# Patient Record
Sex: Male | Born: 1955 | Race: White | Hispanic: No | Marital: Married | Smoking: Never smoker
Health system: Southern US, Community
[De-identification: ages and names within clinical notes are randomized; demographics above are authoritative.]

## PROBLEM LIST (undated history)

## (undated) DIAGNOSIS — I10 Essential (primary) hypertension: Secondary | ICD-10-CM

---

## 2008-06-11 ENCOUNTER — Emergency Department (HOSPITAL_BASED_OUTPATIENT_CLINIC_OR_DEPARTMENT_OTHER): Admission: EM | Admit: 2008-06-11 | Discharge: 2008-06-11 | Payer: Self-pay | Admitting: Emergency Medicine

## 2008-06-11 ENCOUNTER — Ambulatory Visit: Payer: Self-pay | Admitting: Diagnostic Radiology

## 2016-10-23 ENCOUNTER — Emergency Department (HOSPITAL_BASED_OUTPATIENT_CLINIC_OR_DEPARTMENT_OTHER)
Admission: EM | Admit: 2016-10-23 | Discharge: 2016-10-23 | Disposition: A | Discharge status: Home / Self Care | Payer: BLUE CROSS/BLUE SHIELD | Attending: Emergency Medicine | Admitting: Emergency Medicine

## 2016-10-23 ENCOUNTER — Emergency Department (HOSPITAL_BASED_OUTPATIENT_CLINIC_OR_DEPARTMENT_OTHER): Payer: BLUE CROSS/BLUE SHIELD

## 2016-10-23 ENCOUNTER — Encounter (HOSPITAL_BASED_OUTPATIENT_CLINIC_OR_DEPARTMENT_OTHER): Payer: Self-pay | Admitting: Emergency Medicine

## 2016-10-23 DIAGNOSIS — Y999 Unspecified external cause status: Secondary | ICD-10-CM | POA: Diagnosis not present

## 2016-10-23 DIAGNOSIS — D649 Anemia, unspecified: Secondary | ICD-10-CM | POA: Diagnosis not present

## 2016-10-23 DIAGNOSIS — I10 Essential (primary) hypertension: Secondary | ICD-10-CM | POA: Diagnosis not present

## 2016-10-23 DIAGNOSIS — R42 Dizziness and giddiness: Secondary | ICD-10-CM

## 2016-10-23 DIAGNOSIS — Y939 Activity, unspecified: Secondary | ICD-10-CM | POA: Insufficient documentation

## 2016-10-23 DIAGNOSIS — S161XXA Strain of muscle, fascia and tendon at neck level, initial encounter: Secondary | ICD-10-CM | POA: Insufficient documentation

## 2016-10-23 DIAGNOSIS — Y929 Unspecified place or not applicable: Secondary | ICD-10-CM | POA: Insufficient documentation

## 2016-10-23 HISTORY — DX: Essential (primary) hypertension: I10

## 2016-10-23 LAB — I-STAT CG4 LACTIC ACID, ED: Lactic Acid, Venous: 1.51 mmol/L (ref 0.5–1.9)

## 2016-10-23 LAB — CBC
HCT: 28.5 % — ABNORMAL LOW (ref 39.0–52.0)
Hemoglobin: 8.8 g/dL — ABNORMAL LOW (ref 13.0–17.0)
MCH: 24.6 pg — ABNORMAL LOW (ref 26.0–34.0)
MCHC: 30.9 g/dL (ref 30.0–36.0)
MCV: 79.8 fL (ref 78.0–100.0)
PLATELETS: 516 10*3/uL — AB (ref 150–400)
RBC: 3.57 MIL/uL — AB (ref 4.22–5.81)
RDW: 15.9 % — AB (ref 11.5–15.5)
WBC: 8.2 10*3/uL (ref 4.0–10.5)

## 2016-10-23 LAB — COMPREHENSIVE METABOLIC PANEL
ALT: 16 U/L — AB (ref 17–63)
AST: 15 U/L (ref 15–41)
Albumin: 3 g/dL — ABNORMAL LOW (ref 3.5–5.0)
Alkaline Phosphatase: 91 U/L (ref 38–126)
Anion gap: 12 (ref 5–15)
BILIRUBIN TOTAL: 0.3 mg/dL (ref 0.3–1.2)
BUN: 16 mg/dL (ref 6–20)
CHLORIDE: 100 mmol/L — AB (ref 101–111)
CO2: 25 mmol/L (ref 22–32)
CREATININE: 0.92 mg/dL (ref 0.61–1.24)
Calcium: 8.9 mg/dL (ref 8.9–10.3)
Glucose, Bld: 118 mg/dL — ABNORMAL HIGH (ref 65–99)
POTASSIUM: 4 mmol/L (ref 3.5–5.1)
Sodium: 137 mmol/L (ref 135–145)
TOTAL PROTEIN: 7.2 g/dL (ref 6.5–8.1)

## 2016-10-23 LAB — OCCULT BLOOD X 1 CARD TO LAB, STOOL: FECAL OCCULT BLD: NEGATIVE

## 2016-10-23 MED ORDER — IOPAMIDOL (ISOVUE-370) INJECTION 76%
100.0000 mL | Freq: Once | INTRAVENOUS | Status: AC | PRN
  Administered 2016-10-23: 100 mL via INTRAVENOUS

## 2016-10-23 MED ORDER — CYCLOBENZAPRINE HCL 10 MG PO TABS: 10.0000 mg | ORAL_TABLET | Freq: Two times a day (BID) | ORAL | 0 refills | Status: AC | PRN

## 2016-10-23 MED ORDER — SODIUM CHLORIDE 0.9 % IV BOLUS (SEPSIS)
1000.0000 mL | Freq: Once | INTRAVENOUS | Status: AC
  Administered 2016-10-23: 1000 mL via INTRAVENOUS

## 2016-10-23 MED ORDER — DOCUSATE SODIUM 100 MG PO CAPS: 100.0000 mg | ORAL_CAPSULE | Freq: Every day | ORAL | 0 refills | Status: AC

## 2016-10-23 MED ORDER — FERROUS SULFATE 325 (65 FE) MG PO TABS: 325.0000 mg | ORAL_TABLET | Freq: Every day | ORAL | 0 refills | Status: AC

## 2016-10-23 MED ORDER — POLYETHYLENE GLYCOL 3350 17 GM/SCOOP PO POWD: 17.0000 g | Freq: Two times a day (BID) | ORAL | 0 refills | Status: AC
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
## 2016-10-23 NOTE — ED Notes (Signed)
Patient transported to CT 

## 2016-10-23 NOTE — ED Notes (Signed)
ED Provider at bedside. 
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
## 2016-10-23 NOTE — Discharge Instructions (Signed)
1. STOP TAKING IBUPROFEN AND TAKE TYLENOL AND/OR FLEXERIL INSTEAD. 2. START TAKING IRON DAILY WITH COLACE AND MIRALAX TO PREVENT CONSIPATION. 3. SEE PRIMARY CARE PROVIDER AS SOON AS POSSIBLE.

## 2019-12-10 ENCOUNTER — Encounter (HOSPITAL_BASED_OUTPATIENT_CLINIC_OR_DEPARTMENT_OTHER): Payer: Self-pay

## 2019-12-10 ENCOUNTER — Emergency Department (HOSPITAL_BASED_OUTPATIENT_CLINIC_OR_DEPARTMENT_OTHER)
Admission: EM | Admit: 2019-12-10 | Discharge: 2019-12-10 | Disposition: A | Discharge status: Home / Self Care | Payer: BC Managed Care – PPO | Attending: Emergency Medicine | Admitting: Emergency Medicine

## 2019-12-10 ENCOUNTER — Emergency Department (HOSPITAL_BASED_OUTPATIENT_CLINIC_OR_DEPARTMENT_OTHER): Payer: BC Managed Care – PPO

## 2019-12-10 ENCOUNTER — Other Ambulatory Visit: Payer: Self-pay

## 2019-12-10 DIAGNOSIS — S0990XA Unspecified injury of head, initial encounter: Secondary | ICD-10-CM | POA: Diagnosis present

## 2019-12-10 DIAGNOSIS — R079 Chest pain, unspecified: Secondary | ICD-10-CM | POA: Diagnosis not present

## 2019-12-10 DIAGNOSIS — S060X1A Concussion with loss of consciousness of 30 minutes or less, initial encounter: Secondary | ICD-10-CM

## 2019-12-10 DIAGNOSIS — I10 Essential (primary) hypertension: Secondary | ICD-10-CM | POA: Insufficient documentation

## 2019-12-10 LAB — CBC
HCT: 40.9 % (ref 39.0–52.0)
Hemoglobin: 14 g/dL (ref 13.0–17.0)
MCH: 31.4 pg (ref 26.0–34.0)
MCHC: 34.2 g/dL (ref 30.0–36.0)
MCV: 91.7 fL (ref 80.0–100.0)
Platelets: 241 10*3/uL (ref 150–400)
RBC: 4.46 MIL/uL (ref 4.22–5.81)
RDW: 12.5 % (ref 11.5–15.5)
WBC: 7.1 10*3/uL (ref 4.0–10.5)
nRBC: 0 % (ref 0.0–0.2)

## 2019-12-10 LAB — TROPONIN I (HIGH SENSITIVITY)
Troponin I (High Sensitivity): 5 ng/L (ref <18)
Troponin I (High Sensitivity): 5 ng/L (ref <18)

## 2019-12-10 LAB — BASIC METABOLIC PANEL
Anion gap: 10 (ref 5–15)
BUN: 22 mg/dL (ref 8–23)
CO2: 24 mmol/L (ref 22–32)
Calcium: 9.2 mg/dL (ref 8.9–10.3)
Chloride: 106 mmol/L (ref 98–111)
Creatinine, Ser: 0.9 mg/dL (ref 0.61–1.24)
GFR, Estimated: >60 mL/min (ref >60)
Glucose, Bld: 100 mg/dL — ABNORMAL HIGH (ref 70–99)
Potassium: 3.9 mmol/L (ref 3.5–5.1)
Sodium: 140 mmol/L (ref 135–145)

## 2019-12-10 MED ORDER — IOHEXOL 350 MG/ML SOLN
100.0000 mL | Freq: Once | INTRAVENOUS | Status: AC | PRN
  Administered 2019-12-10: 100 mL via INTRAVENOUS
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
## 2019-12-10 NOTE — ED Notes (Signed)
ED Provider at bedside. 

## 2019-12-10 NOTE — ED Notes (Signed)
PT changed into Gown and hooked up to monitor, Q30 BP
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
## 2019-12-10 NOTE — ED Notes (Signed)
Discussed case with EDP Rees-orders to continue with CP order set protocol only at this time
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
## 2022-05-10 ENCOUNTER — Emergency Department (HOSPITAL_BASED_OUTPATIENT_CLINIC_OR_DEPARTMENT_OTHER)
Admission: EM | Admit: 2022-05-10 | Discharge: 2022-05-10 | Disposition: A | Discharge status: Home / Self Care | Payer: BC Managed Care – PPO | Attending: Emergency Medicine | Admitting: Emergency Medicine

## 2022-05-10 ENCOUNTER — Emergency Department (HOSPITAL_BASED_OUTPATIENT_CLINIC_OR_DEPARTMENT_OTHER): Payer: BC Managed Care – PPO

## 2022-05-10 ENCOUNTER — Other Ambulatory Visit: Payer: Self-pay

## 2022-05-10 ENCOUNTER — Encounter (HOSPITAL_BASED_OUTPATIENT_CLINIC_OR_DEPARTMENT_OTHER): Payer: Self-pay | Admitting: Emergency Medicine

## 2022-05-10 DIAGNOSIS — I10 Essential (primary) hypertension: Secondary | ICD-10-CM | POA: Diagnosis not present

## 2022-05-10 DIAGNOSIS — L03211 Cellulitis of face: Secondary | ICD-10-CM | POA: Diagnosis not present

## 2022-05-10 DIAGNOSIS — I160 Hypertensive urgency: Secondary | ICD-10-CM | POA: Diagnosis not present

## 2022-05-10 DIAGNOSIS — K029 Dental caries, unspecified: Secondary | ICD-10-CM

## 2022-05-10 DIAGNOSIS — R220 Localized swelling, mass and lump, head: Secondary | ICD-10-CM | POA: Diagnosis present

## 2022-05-10 LAB — CBC WITH DIFFERENTIAL/PLATELET
Abs Immature Granulocytes: 0.07 10*3/uL (ref 0.00–0.07)
Basophils Absolute: 0.1 10*3/uL (ref 0.0–0.1)
Basophils Relative: 1 %
Eosinophils Absolute: 0.1 10*3/uL (ref 0.0–0.5)
Eosinophils Relative: 2 %
HCT: 40.6 % (ref 39.0–52.0)
Hemoglobin: 14.1 g/dL (ref 13.0–17.0)
Immature Granulocytes: 1 %
Lymphocytes Relative: 15 %
Lymphs Abs: 1.1 10*3/uL (ref 0.7–4.0)
MCH: 31.6 pg (ref 26.0–34.0)
MCHC: 34.7 g/dL (ref 30.0–36.0)
MCV: 91 fL (ref 80.0–100.0)
Monocytes Absolute: 0.8 10*3/uL (ref 0.1–1.0)
Monocytes Relative: 11 %
Neutro Abs: 5.2 10*3/uL (ref 1.7–7.7)
Neutrophils Relative %: 70 %
Platelets: 218 10*3/uL (ref 150–400)
RBC: 4.46 MIL/uL (ref 4.22–5.81)
RDW: 12.8 % (ref 11.5–15.5)
WBC: 7.3 10*3/uL (ref 4.0–10.5)
nRBC: 0 % (ref 0.0–0.2)

## 2022-05-10 LAB — COMPREHENSIVE METABOLIC PANEL
ALT: 29 U/L (ref 0–44)
AST: 23 U/L (ref 15–41)
Albumin: 3.9 g/dL (ref 3.5–5.0)
Alkaline Phosphatase: 98 U/L (ref 38–126)
Anion gap: 8 (ref 5–15)
BUN: 11 mg/dL (ref 8–23)
CO2: 24 mmol/L (ref 22–32)
Calcium: 8.6 mg/dL — ABNORMAL LOW (ref 8.9–10.3)
Chloride: 102 mmol/L (ref 98–111)
Creatinine, Ser: 0.84 mg/dL (ref 0.61–1.24)
GFR, Estimated: >60 mL/min (ref >60)
Glucose, Bld: 129 mg/dL — ABNORMAL HIGH (ref 70–99)
Potassium: 3.6 mmol/L (ref 3.5–5.1)
Sodium: 134 mmol/L — ABNORMAL LOW (ref 135–145)
Total Bilirubin: 0.8 mg/dL (ref 0.3–1.2)
Total Protein: 6.9 g/dL (ref 6.5–8.1)

## 2022-05-10 LAB — TROPONIN I (HIGH SENSITIVITY)
Troponin I (High Sensitivity): 10 ng/L (ref <18)
Troponin I (High Sensitivity): 10 ng/L (ref <18)

## 2022-05-10 LAB — PROCALCITONIN: Procalcitonin: <0.1 ng/mL

## 2022-05-10 MED ORDER — DIPHENHYDRAMINE HCL 50 MG/ML IJ SOLN
12.5000 mg | Freq: Once | INTRAMUSCULAR | Status: AC
  Administered 2022-05-10: 12.5 mg via INTRAVENOUS
  Filled 2022-05-10: qty 1

## 2022-05-10 MED ORDER — SODIUM CHLORIDE 0.9 % IV SOLN
12.5000 mg | Freq: Three times a day (TID) | INTRAVENOUS | Status: DC | PRN
  Filled 2022-05-10: qty 0.5

## 2022-05-10 MED ORDER — LACTATED RINGERS IV BOLUS
1000.0000 mL | Freq: Once | INTRAVENOUS | Status: AC
  Administered 2022-05-10: 1000 mL via INTRAVENOUS

## 2022-05-10 MED ORDER — ONDANSETRON HCL 4 MG/2ML IJ SOLN: 4.0000 mg | Freq: Four times a day (QID) | INTRAMUSCULAR | Status: DC | PRN

## 2022-05-10 MED ORDER — HYDRALAZINE HCL 20 MG/ML IJ SOLN
10.0000 mg | Freq: Once | INTRAMUSCULAR | Status: AC
  Administered 2022-05-10: 10 mg via INTRAVENOUS
  Filled 2022-05-10: qty 1

## 2022-05-10 MED ORDER — DOXYCYCLINE HYCLATE 100 MG PO CAPS: 100.0000 mg | ORAL_CAPSULE | Freq: Two times a day (BID) | ORAL | 0 refills | Status: AC

## 2022-05-10 MED ORDER — SODIUM CHLORIDE 0.9 % IV SOLN: 3.0000 g | Freq: Four times a day (QID) | INTRAVENOUS | Status: DC

## 2022-05-10 MED ORDER — ONDANSETRON 4 MG PO TBDP
4.0000 mg | ORAL_TABLET | Freq: Three times a day (TID) | ORAL | 0 refills | Status: DC | PRN
  Filled 2022-05-10: qty 20, 7d supply, fill #0

## 2022-05-10 MED ORDER — AMOXICILLIN-POT CLAVULANATE 875-125 MG PO TABS
1.0000 | ORAL_TABLET | Freq: Two times a day (BID) | ORAL | 0 refills | Status: DC
  Filled 2022-05-10: qty 14, 7d supply, fill #0

## 2022-05-10 MED ORDER — ACETAMINOPHEN 325 MG PO TABS
650.0000 mg | ORAL_TABLET | Freq: Once | ORAL | Status: DC
  Filled 2022-05-10: qty 2

## 2022-05-10 MED ORDER — IOHEXOL 300 MG/ML  SOLN
100.0000 mL | Freq: Once | INTRAMUSCULAR | Status: AC | PRN
  Administered 2022-05-10: 75 mL via INTRAVENOUS

## 2022-05-10 MED ORDER — CARVEDILOL 6.25 MG PO TABS: 6.2500 mg | ORAL_TABLET | Freq: Two times a day (BID) | ORAL | Status: DC

## 2022-05-10 MED ORDER — SODIUM CHLORIDE 0.9 % IV SOLN
3.0000 g | Freq: Once | INTRAVENOUS | Status: AC
  Administered 2022-05-10: 3 g via INTRAVENOUS

## 2022-05-10 MED ORDER — KETOROLAC TROMETHAMINE 30 MG/ML IJ SOLN
15.0000 mg | Freq: Once | INTRAMUSCULAR | Status: AC
  Administered 2022-05-10: 15 mg via INTRAVENOUS
  Filled 2022-05-10: qty 1

## 2022-05-10 MED ORDER — LACTATED RINGERS IV SOLN: INTRAVENOUS | Status: DC

## 2022-05-10 MED ORDER — HYDRALAZINE HCL 20 MG/ML IJ SOLN: 10.0000 mg | Freq: Four times a day (QID) | INTRAMUSCULAR | Status: DC | PRN

## 2022-05-10 MED ORDER — AMOXICILLIN-POT CLAVULANATE 875-125 MG PO TABS: 1.0000 | ORAL_TABLET | Freq: Two times a day (BID) | ORAL | 0 refills | Status: AC

## 2022-05-10 MED ORDER — AMLODIPINE BESYLATE 5 MG PO TABS: 10.0000 mg | ORAL_TABLET | Freq: Every day | ORAL | Status: DC

## 2022-05-10 MED ORDER — METOCLOPRAMIDE HCL 5 MG/ML IJ SOLN
10.0000 mg | Freq: Once | INTRAMUSCULAR | Status: AC
  Administered 2022-05-10: 10 mg via INTRAVENOUS
  Filled 2022-05-10: qty 2

## 2022-05-10 MED ORDER — ONDANSETRON 4 MG PO TBDP: 4.0000 mg | ORAL_TABLET | Freq: Three times a day (TID) | ORAL | 0 refills | Status: AC | PRN

## 2022-05-10 MED ORDER — DOXYCYCLINE HYCLATE 100 MG PO CAPS
100.0000 mg | ORAL_CAPSULE | Freq: Two times a day (BID) | ORAL | 0 refills | Status: DC
  Filled 2022-05-10: qty 14, 7d supply, fill #0

## 2022-05-10 MED ORDER — ONDANSETRON HCL 4 MG/2ML IJ SOLN
4.0000 mg | Freq: Once | INTRAMUSCULAR | Status: AC
  Administered 2022-05-10: 4 mg via INTRAVENOUS
  Filled 2022-05-10: qty 2
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
## 2022-05-10 NOTE — ED Provider Notes (Signed)
[place] [facility] Provider Note   CSN: [id number] Arrival date & time: 05/10/22  1123     History  Chief Complaint  Patient presents with   Neck Swelling    Kevin Hartman is a 67 y.o. male.  HPI   67 year old male with medical history significant for hypertension who presents to the emergency department with multiple complaints.  The patient states that he developed right-sided jaw swelling over the past day.  He has a history of poor dentition.  He states that the swelling has radiated from his jaw and is now present on the right side of his neck.  He denies any difficulty swallowing or difficulty breathing.  He denies any fevers or chills.  Additionally, the patient states he developed a headache yesterday located posteriorly radiating to the front of his head.  He had high blood pressure today associated with his headache.  No fevers, neck stiffness.  He states that he had blurry vision associated with his high blood pressure.  He Hartman in clinic for a prostate biopsy earlier today and his blood pressure Hartman abnormally high and so he Hartman sent to the emergency department for further evaluation.  He continues to endorse a headache and mild blurry vision with associated nausea.  He states also that he developed mild chest pressure earlier today, no radiation, no shortness of breath.   Home Medications Prior to Admission medications   Medication Sig Start Date End Date Taking? Authorizing Provider  cyclobenzaprine (FLEXERIL) 10 MG tablet Take 1 tablet (10 mg total) by mouth 2 (two) times daily as needed for muscle spasms. 10/23/16   Little, Wenda Overland, MD  docusate sodium (COLACE) 100 MG capsule Take 1 capsule (100 mg total) by mouth daily. 10/23/16   Little, Wenda Overland, MD  ferrous sulfate 325 (65 FE) MG tablet Take 1 tablet (325 mg total) by mouth daily. 10/23/16   Little, Wenda Overland, MD  polyethylene glycol powder (GLYCOLAX/MIRALAX) powder Take  17 g by mouth 2 (two) times daily. Or as needed Until daily soft stools  OTC 10/23/16   Little, Wenda Overland, MD      Allergies    Tramadol    Review of Systems   Review of Systems  All other systems reviewed and are negative.   Physical Exam Updated Vital Signs BP (!) 166/103   Pulse 82   Temp (!) 97.5 F (36.4 C) (Oral)   Resp 20   Ht '5\' 11"'$  (1.803 m)   Wt 104.3 kg   SpO2 97%   BMI 32.08 kg/m  Physical Exam Vitals and nursing note reviewed.  Constitutional:      General: He is not in acute distress.    Appearance: He is well-developed.  HENT:     Head: Normocephalic and atraumatic.     Comments: Swelling about the right jaw and neck, no crepitus. No trismus. No clear periodontal abscess Eyes:     Conjunctiva/sclera: Conjunctivae normal.  Cardiovascular:     Rate and Rhythm: Normal rate and regular rhythm.  Pulmonary:     Effort: Pulmonary effort is normal. No respiratory distress.     Breath sounds: Normal breath sounds.  Abdominal:     Palpations: Abdomen is soft.     Tenderness: There is no abdominal tenderness.  Musculoskeletal:        General: No swelling.     Cervical back: Neck supple.  Skin:    General: Skin is warm and dry.  Capillary Refill: Capillary refill takes less than 2 seconds.  Neurological:     Mental Status: He is alert.     GCS: GCS eye subscore is 4. GCS verbal subscore is 5. GCS motor subscore is 6.     Cranial Nerves: Cranial nerves 2-12 are intact.     Sensory: Sensation is intact.     Motor: Motor function is intact.     Coordination: Coordination is intact.  Psychiatric:        Mood and Affect: Mood normal.     ED Results / Procedures / Treatments   Labs (all labs ordered are listed, but only abnormal results are displayed) Labs Reviewed  COMPREHENSIVE METABOLIC PANEL - Abnormal; Notable for the following components:      Result Value   Sodium 134 (*)    Glucose, Bld 129 (*)    Calcium 8.6 (*)    All other components  within normal limits  CULTURE, BLOOD (ROUTINE X 2)  CULTURE, BLOOD (ROUTINE X 2)  CBC WITH DIFFERENTIAL/PLATELET  TROPONIN I (HIGH SENSITIVITY)  TROPONIN I (HIGH SENSITIVITY)    EKG EKG Interpretation  Date/Time:  Tuesday May 10 2022 11:53:37 EDT Ventricular Rate:  77 PR Interval:  156 QRS Duration: 96 QT Interval:  373 QTC Calculation: 423 R Axis:   33 Text Interpretation: Sinus rhythm No significant change since last tracing Confirmed by Regan Lemming ([id number]) on 05/10/2022 12:06:09 PM  Radiology CT Soft Tissue Neck W Contrast  Result Date: 05/10/2022 CLINICAL DATA:  Soft tissue infection suspected. Right-sided jaw and neck swelling EXAM: CT NECK WITH CONTRAST TECHNIQUE: Multidetector CT imaging of the neck Hartman performed using the standard protocol following the bolus administration of intravenous contrast. RADIATION DOSE REDUCTION: This exam Hartman performed according to the departmental dose-optimization program which includes automated exposure control, adjustment of the mA and/or kV according to patient size and/or use of iterative reconstruction technique. CONTRAST:  71m OMNIPAQUE IOHEXOL 300 MG/ML  SOLN COMPARISON:  12/10/2019 FINDINGS: Pharynx and larynx: No mucosal or submucosal lesion. No evidence of deep space inflammation. Salivary glands: Parotid and submandibular glands are normal. Thyroid: Normal Lymph nodes: No lymphadenopathy on either side of the neck. Vascular: Carotid artery atherosclerosis. No flow limiting stenosis suspected at either bifurcation. This is not a detailed vascular study. Limited intracranial: Normal Visualized orbits: Normal Mastoids and visualized paranasal sinuses: Clear Skeleton: Degenerative spondylosis and facet arthritis. No acute finding of the cervical spine. There is abnormal lucency around the roots right mandibular teeth 29 and 30 with advanced dental decay a. maxillary dentition all show shows decay and periodontal disease. Soft tissue swelling of  the gingival tissues adjacent to the right mandible, probably driving from the disease of teeth 29 and 30. No evidence of low-density collection. Cellulitis type pattern within the fat of the right jaw and right neck without drainable soft tissue abscess lower in the neck. Upper chest: Normal Other: None IMPRESSION: Advanced dental decay and periodontal disease affecting the right mandibular teeth 29 and 30. Soft tissue swelling of the gingival tissues adjacent to the right mandible, probably driving from the disease of teeth 29 and 30. No evidence of low-density collection. Cellulitis type pattern within the fat of the right jaw and right neck without drainable soft tissue abscess. No deep space extension. Electronically Signed   By: MNelson ChimesM.D.   On: 05/10/2022 15:05   CT Head Wo Contrast  Result Date: 05/10/2022 CLINICAL DATA:  Headache. Neurological deficit. Right-sided jaw and neck swelling.  Blurred vision. EXAM: CT HEAD WITHOUT CONTRAST TECHNIQUE: Contiguous axial images were obtained from the base of the skull through the vertex without intravenous contrast. RADIATION DOSE REDUCTION: This exam Hartman performed according to the departmental dose-optimization program which includes automated exposure control, adjustment of the mA and/or kV according to patient size and/or use of iterative reconstruction technique. COMPARISON:  08/01/2021 FINDINGS: Brain: No focal abnormality affects the brainstem or cerebellum. Cerebral hemispheres show advanced chronic small-vessel ischemic change of the white matter. No identifiable acute infarction, mass lesion, hemorrhage, hydrocephalus or extra-axial collection. Vascular: There is atherosclerotic calcification of the major vessels at the base of the brain. Skull: Negative Sinuses/Orbits: Clear/normal Other: None IMPRESSION: No acute CT finding. Advanced chronic small-vessel ischemic changes of the cerebral hemispheric white matter. Electronically Signed   By: Nelson Chimes M.D.   On: 05/10/2022 15:00   DG Chest Portable 1 View  Result Date: 05/10/2022 CLINICAL DATA:  Chest pressure.  Hypertension. EXAM: PORTABLE CHEST 1 VIEW COMPARISON:  12/10/2019 FINDINGS: Heart size is normal. Aortic atherosclerotic calcification is seen. Question hiatal hernia. The lungs are clear. The vascularity is normal. No effusions. IMPRESSION: No active disease. Aortic atherosclerotic calcification. Question hiatal hernia. Electronically Signed   By: Nelson Chimes M.D.   On: 05/10/2022 14:58    Procedures Procedures    Medications Ordered in ED Medications  acetaminophen (TYLENOL) tablet 650 mg (650 mg Oral Not Given 05/10/22 1533)  ketorolac (TORADOL) 30 MG/ML injection 15 mg (has no administration in time range)  ondansetron (ZOFRAN) injection 4 mg (has no administration in time range)  Ampicillin-Sulbactam (UNASYN) 3 g in sodium chloride 0.9 % 100 mL IVPB (0 g Intravenous Stopped 05/10/22 1536)  lactated ringers bolus 1,000 mL (0 mLs Intravenous Stopped 05/10/22 1536)  metoCLOPramide (REGLAN) injection 10 mg (10 mg Intravenous Given 05/10/22 1357)  diphenhydrAMINE (BENADRYL) injection 12.5 mg (12.5 mg Intravenous Given 05/10/22 1355)  iohexol (OMNIPAQUE) 300 MG/ML solution 100 mL (75 mLs Intravenous Contrast Given 05/10/22 1433)  hydrALAZINE (APRESOLINE) injection 10 mg (10 mg Intravenous Given 05/10/22 1534)  hydrALAZINE (APRESOLINE) injection 10 mg (10 mg Intravenous Given 05/10/22 1614)    ED Course/ Medical Decision Making/ A&P Clinical Course as of 05/10/22 1656  Tue May 10, 2022  1601 66 M with right jaw and face cellulitis, no abscess. Getting IV antibiotics. Also having HA, normal head CT, HTN, follow up Trops and symptoms/BP.  [VB]  [id number] Spoke with Dr. Haig Prophet or on-call dentistry, he says he does not typically come into the hospital to do inpatient consults or pull teeth.  He would recommend continued antibiotics and then follow-up outpatient and recommends blood  pressure control before he would be able to pull patient's tooth. [VB]  [id number] Patient still complaining of headache there Hartman no ICH on CT head.  Blood pressures have been improving but I have ordered for Zofran since he is unable to keep down medications having hypertensive urgency will request admission for continued IV antibiotics as well for facial cellulitis and hypertension control. [VB]    Clinical Course User Index [VB] Elgie Congo, MD                             Medical Decision Making Amount and/or Complexity of Data Reviewed Labs: ordered. Radiology: ordered.  Risk OTC drugs. Prescription drug management.    67 year old male with medical history significant for hypertension who presents to the emergency department with multiple complaints.  The patient states that he developed right-sided jaw swelling over the past day.  He has a history of poor dentition.  He states that the swelling has radiated from his jaw and is now present on the right side of his neck.  He denies any difficulty swallowing or difficulty breathing.  He denies any fevers or chills.  Additionally, the patient states he developed a headache yesterday located posteriorly radiating to the front of his head.  He had high blood pressure today associated with his headache.  No fevers, neck stiffness.  He states that he had blurry vision associated with his high blood pressure.  He Hartman in clinic for a prostate biopsy earlier today and his blood pressure Hartman abnormally high and so he Hartman sent to the emergency department for further evaluation.  He continues to endorse a headache and mild blurry vision with associated nausea.  He states also that he developed mild chest pressure earlier today, no radiation, no shortness of breath.   On arrival, the patient Hartman afebrile, not tachycardic, mildly tachypneic RR 22, BP 176/114.  Differential diagnosis includes odontogenic abscess, facial cellulitis, less likely allergic  reaction or angioedema.  No tongue elevation to suggest Ludwig's Angina.  Additionally considered ACS, hypertensive emergency, migraine headache. Headache Hartman gradual in onset, not maximal in onset, came on gradually over the afternoon yesterday, lower concern for SAH/ICH.  CT Head:  IMPRESSION:  No acute CT finding. Advanced chronic small-vessel ischemic changes  of the cerebral hemispheric white matter.   IV access Hartman obtained and the patient Hartman administered a migraine cocktail in addition to IV Unasyn due to concern for possible facial and neck infection.  CT Neck W: IMPRESSION:  Advanced dental decay and periodontal disease affecting the right  mandibular teeth 29 and 30. Soft tissue swelling of the gingival  tissues adjacent to the right mandible, probably driving from the  disease of teeth 29 and 30. No evidence of low-density collection.  Cellulitis type pattern within the fat of the right jaw and right  neck without drainable soft tissue abscess. No deep space extension.   CXR: No acute abnormality  Initial EKG revealed sinus rhythm, no acute ischemic changes.  Laboratory evaluation significant for initial cardiac troponin 10, CMP without evidence of an AKI or liver dysfunction, no significant electrolyte abnormality,'s CBC without leukocytosis or anemia.  Due to the patient's blurry vision and uncontrolled hypertension, considered hypertensive emergency.  He Hartman administered hydralazine IV x 2 and endorses persistent headache symptoms.  Plan at time of signout to follow-up the patient's remaining troponin, consider admission for uncontrolled hypertension.  If coming to the hospital, patient could benefit from dentistry consult.  Signout given to Dr. Nechama Guard at 1600.  Final Clinical Impression(s) / ED Diagnoses Final diagnoses:  Hypertensive urgency  Facial cellulitis    Rx / DC Orders ED Discharge Orders     None         Regan Lemming, MD 05/10/22 1656

## 2022-05-10 NOTE — ED Notes (Signed)
Patient still reporting pain, provider made aware

## 2022-05-10 NOTE — ED Notes (Signed)
Blood culture number two drawn via 23 gauge butterfly from R hand.

## 2022-05-10 NOTE — ED Notes (Signed)
Pt in bed pt reports decreased pain, pt states that he is feeling better and ready to go home, sig other at bedside, pt verbalized understanding d/c instructions and follow up, reviewed prescriptions, pt ambulatory from dpt.

## 2022-05-10 NOTE — Plan of Care (Signed)
Kevin Hartman, is a 67 y.o. male, DOB - 1956/02/20, [id number] with no major medical problems, comes in with dental pain and facial swelling, has dental infection and facial cellulitis, no abscess. Dentist on call Dr Haig Prophet - wants IV ABX x few days then outpt follow up.   HTN poor controlled with headache and nuasea - likely from facial pain - Coreg + Norvasc + hydralazine PRN.  Admit to Tele, IV A, BP Control.     Vitals:   05/10/22 1602 05/10/22 1615 05/10/22 1630 05/10/22 1700  BP:  (!) 184/100 (!) 153/88 (!) 172/110  Pulse:  80 93 100  Resp:  (!) '27 18 15  '$ Temp: (!) 97.5 F (36.4 C)     TempSrc: Oral     SpO2:  97% 100% 98%  Weight:      Height:            Data Review   Micro Results No results found for this or any previous visit (from the past 240 hour(s)).  Radiology Reports CT Soft Tissue Neck W Contrast  Result Date: 05/10/2022 CLINICAL DATA:  Soft tissue infection suspected. Right-sided jaw and neck swelling EXAM: CT NECK WITH CONTRAST TECHNIQUE: Multidetector CT imaging of the neck was performed using the standard protocol following the bolus administration of intravenous contrast. RADIATION DOSE REDUCTION: This exam was performed according to the departmental dose-optimization program which includes automated exposure control, adjustment of the mA and/or kV according to patient size and/or use of iterative reconstruction technique. CONTRAST:  55m OMNIPAQUE IOHEXOL 300 MG/ML  SOLN COMPARISON:  12/10/2019 FINDINGS: Pharynx and larynx: No mucosal or submucosal lesion. No evidence of deep space inflammation. Salivary glands: Parotid and submandibular glands are normal. Thyroid: Normal Lymph nodes: No lymphadenopathy on either side of the neck. Vascular: Carotid artery atherosclerosis. No flow limiting stenosis suspected at either bifurcation. This is not a detailed vascular study.  Limited intracranial: Normal Visualized orbits: Normal Mastoids and visualized paranasal sinuses: Clear Skeleton: Degenerative spondylosis and facet arthritis. No acute finding of the cervical spine. There is abnormal lucency around the roots right mandibular teeth 29 and 30 with advanced dental decay a. maxillary dentition all show shows decay and periodontal disease. Soft tissue swelling of the gingival tissues adjacent to the right mandible, probably driving from the disease of teeth 29 and 30. No evidence of low-density collection. Cellulitis type pattern within the fat of the right jaw and right neck without drainable soft tissue abscess lower in the neck. Upper chest: Normal Other: None IMPRESSION: Advanced dental decay and periodontal disease affecting the right mandibular teeth 29 and 30. Soft tissue swelling of the gingival tissues adjacent to the right mandible, probably driving from the disease of teeth 29 and 30. No evidence of low-density collection. Cellulitis type pattern within the fat of the right jaw and right neck without drainable soft tissue abscess. No deep space extension. Electronically Signed   By: MNelson ChimesM.D.   On: 05/10/2022 15:05   CT Head Wo Contrast  Result Date: 05/10/2022 CLINICAL DATA:  Headache. Neurological deficit. Right-sided jaw and neck swelling. Blurred vision. EXAM: CT HEAD WITHOUT CONTRAST TECHNIQUE: Contiguous axial images were obtained from the base of the skull through the vertex without intravenous contrast. RADIATION DOSE REDUCTION: This exam was performed according to the departmental dose-optimization program which includes automated exposure control, adjustment of the mA and/or kV according to patient size and/or use of iterative reconstruction technique. COMPARISON:  08/01/2021 FINDINGS: Brain: No focal abnormality  affects the brainstem or cerebellum. Cerebral hemispheres show advanced chronic small-vessel ischemic change of the white matter. No  identifiable acute infarction, mass lesion, hemorrhage, hydrocephalus or extra-axial collection. Vascular: There is atherosclerotic calcification of the major vessels at the base of the brain. Skull: Negative Sinuses/Orbits: Clear/normal Other: None IMPRESSION: No acute CT finding. Advanced chronic small-vessel ischemic changes of the cerebral hemispheric white matter. Electronically Signed   By: Nelson Chimes M.D.   On: 05/10/2022 15:00   DG Chest Portable 1 View  Result Date: 05/10/2022 CLINICAL DATA:  Chest pressure.  Hypertension. EXAM: PORTABLE CHEST 1 VIEW COMPARISON:  12/10/2019 FINDINGS: Heart size is normal. Aortic atherosclerotic calcification is seen. Question hiatal hernia. The lungs are clear. The vascularity is normal. No effusions. IMPRESSION: No active disease. Aortic atherosclerotic calcification. Question hiatal hernia. Electronically Signed   By: Nelson Chimes M.D.   On: 05/10/2022 14:58    CBC Recent Labs  Lab 05/10/22 1254  WBC 7.3  HGB 14.1  HCT 40.6  PLT 218  MCV 91.0  MCH 31.6  MCHC 34.7  RDW 12.8  LYMPHSABS 1.1  MONOABS 0.8  EOSABS 0.1  BASOSABS 0.1    Chemistries  Recent Labs  Lab 05/10/22 1254  NA 134*  K 3.6  CL 102  CO2 24  GLUCOSE 129*  BUN 11  CREATININE 0.84  CALCIUM 8.6*  AST 23  ALT 29  ALKPHOS 98  BILITOT 0.8   ------------------------------------------------------------------------------------------------------------------ estimated creatinine clearance is 103.5 mL/min (by C-G formula based on SCr of 0.84 mg/dL). ------------------------------------------------------------------------------------------------------------------ No results for input(s): "HGBA1C" in the last 72 hours. ------------------------------------------------------------------------------------------------------------------ No results for input(s): "CHOL", "HDL", "LDLCALC", "TRIG", "CHOLHDL", "LDLDIRECT" in the last 72  hours. ------------------------------------------------------------------------------------------------------------------ No results for input(s): "TSH", "T4TOTAL", "T3FREE", "THYROIDAB" in the last 72 hours.  Invalid input(s): "FREET3" ------------------------------------------------------------------------------------------------------------------ No results for input(s): "VITAMINB12", "FOLATE", "FERRITIN", "TIBC", "IRON", "RETICCTPCT" in the last 72 hours.  Coagulation profile No results for input(s): "INR", "PROTIME" in the last 168 hours.  No results for input(s): "DDIMER" in the last 72 hours.  Cardiac Enzymes No results for input(s): "CKMB", "TROPONINI", "MYOGLOBIN" in the last 168 hours.  Invalid input(s): "CK" ------------------------------------------------------------------------------------------------------------------ Invalid input(s): "POCBNP"   Signature  Lala Lund M.D on 05/10/2022 at 5:18 PM   -  To page go to [email or web address]

## 2022-05-10 NOTE — ED Provider Notes (Signed)
  Physical Exam  BP (!) 148/99   Pulse 98   Temp (!) 97.5 F (36.4 C) (Oral)   Resp 20   Ht 5\' 11"  (1.803 m)   Wt 104.3 kg   SpO2 91%   BMI 32.08 kg/m   Physical Exam Constitutional:      Appearance: Normal appearance.  HENT:     Head:     Comments: Mild right facial erythema and swelling mandibular region, no tongue raising or drooling, dental caries right posterior inferior molars no abscess Cardiovascular:     Rate and Rhythm: Normal rate.  Neurological:     Mental Status: He is alert.     Procedures  Procedures  ED Course / MDM   Clinical Course as of 05/10/22 1820  Tue May 10, 2022  1601 26 M with right jaw and face cellulitis, no abscess. Getting IV antibiotics. Also having HA, normal head CT, HTN, follow up Trops and symptoms/BP.  [VB]  [id number] Spoke with Dr. Haig Prophet or on-call dentistry, he says he does not typically come into the hospital to do inpatient consults or pull teeth.  He would recommend continued antibiotics and then follow-up outpatient and recommends blood pressure control before he would be able to pull patient's tooth. [VB]  [id number] Patient still complaining of headache there was no ICH on CT head.  Blood pressures have been improving but I have ordered for Zofran since he is unable to keep down medications having hypertensive urgency will request admission for continued IV antibiotics as well for facial cellulitis and hypertension control. [VB]  1717 Spoke with Dr. Candiss Norse, plan to admit patient for continued blood pressure management in the setting of hypertensive urgency and continued IV antibiotics for facial cellulitis [VB]  6834 . [VB]  1962 I reassessed the patient as he is requesting go home.  He feels significantly better and headache is resolved.  He is neurologically intact.  Blood pressure improved 148/99.  Without any chest pain or shortness of breath.  Face looks significantly improved he has no signs of Ludwig's.  Will provide with dental  outpatient send follow-up and continued antibiotics and strict return precautions and follow-up with PCP for blood pressure management.  He is in agreement plan safer discharge at this time. [VB]    Clinical Course User Index [VB] Elgie Congo, MD   Medical Decision Making Amount and/or Complexity of Data Reviewed Labs: ordered. Radiology: ordered.  Risk OTC drugs. Prescription drug management. Decision regarding hospitalization.       Elgie Congo, MD 05/10/22 [phone]

## 2022-05-10 NOTE — ED Notes (Signed)
Pt in bed, pt reports headache and htn since last night, pt states that his pain has progressively gotten worse. Pt awake and oriented, wife states that he has slight slurred speech.

## 2022-05-10 NOTE — ED Triage Notes (Signed)
Pt arrives pov, slow gait, c/o RT side jaw and neck swelling. Also endorse HA, hypertension and blurred vision with nausea

## 2022-05-10 NOTE — Discharge Instructions (Signed)
You were seen in the emergency department today for elevated blood pressure and facial swelling and pain.    You have an infection of the face that is coming from your teeth.  Take the antibiotics as prescribed do not miss any doses.  Make an appointment with the dentist Dr. Haig Prophet listed in your discharge paperwork tomorrow to get your teeth removed.  You can also show up in the office to make an appointment or use the dental resources chart.  Take Tylenol and ibuprofen as needed for pain.  Take Zofran as needed for nausea or vomiting.   Regarding her high blood pressures :  Your exam/workup today does not appear to show any abnormalities that would require admission. It is very important that you take all of your medications as prescribed, every day.   Please follow up with your doctor in the next 2-3 days to discuss your elevated blood pressures and your current medication regimen.  Return to the emergency department if you develop any sudden/severe headache, confusion, slurred speech, worsening vision, weakness or numbness of any arm or leg, or for any chest pain, pressure, worsening facial swelling, drooling or trouble breathing.

## 2022-05-11 ENCOUNTER — Other Ambulatory Visit (HOSPITAL_BASED_OUTPATIENT_CLINIC_OR_DEPARTMENT_OTHER): Payer: Self-pay

## 2022-05-15 LAB — CULTURE, BLOOD (ROUTINE X 2)
Culture: NO GROWTH
Culture: NO GROWTH
Special Requests: ADEQUATE
Special Requests: ADEQUATE

## 2022-10-05 IMAGING — CR DG CHEST 2V
2 series · 2 of 2 positions shown · non-contrast
Comparison: 0505

CLINICAL DATA: Chest pain

EXAM:
CHEST - 2 VIEW

[w chest pa]
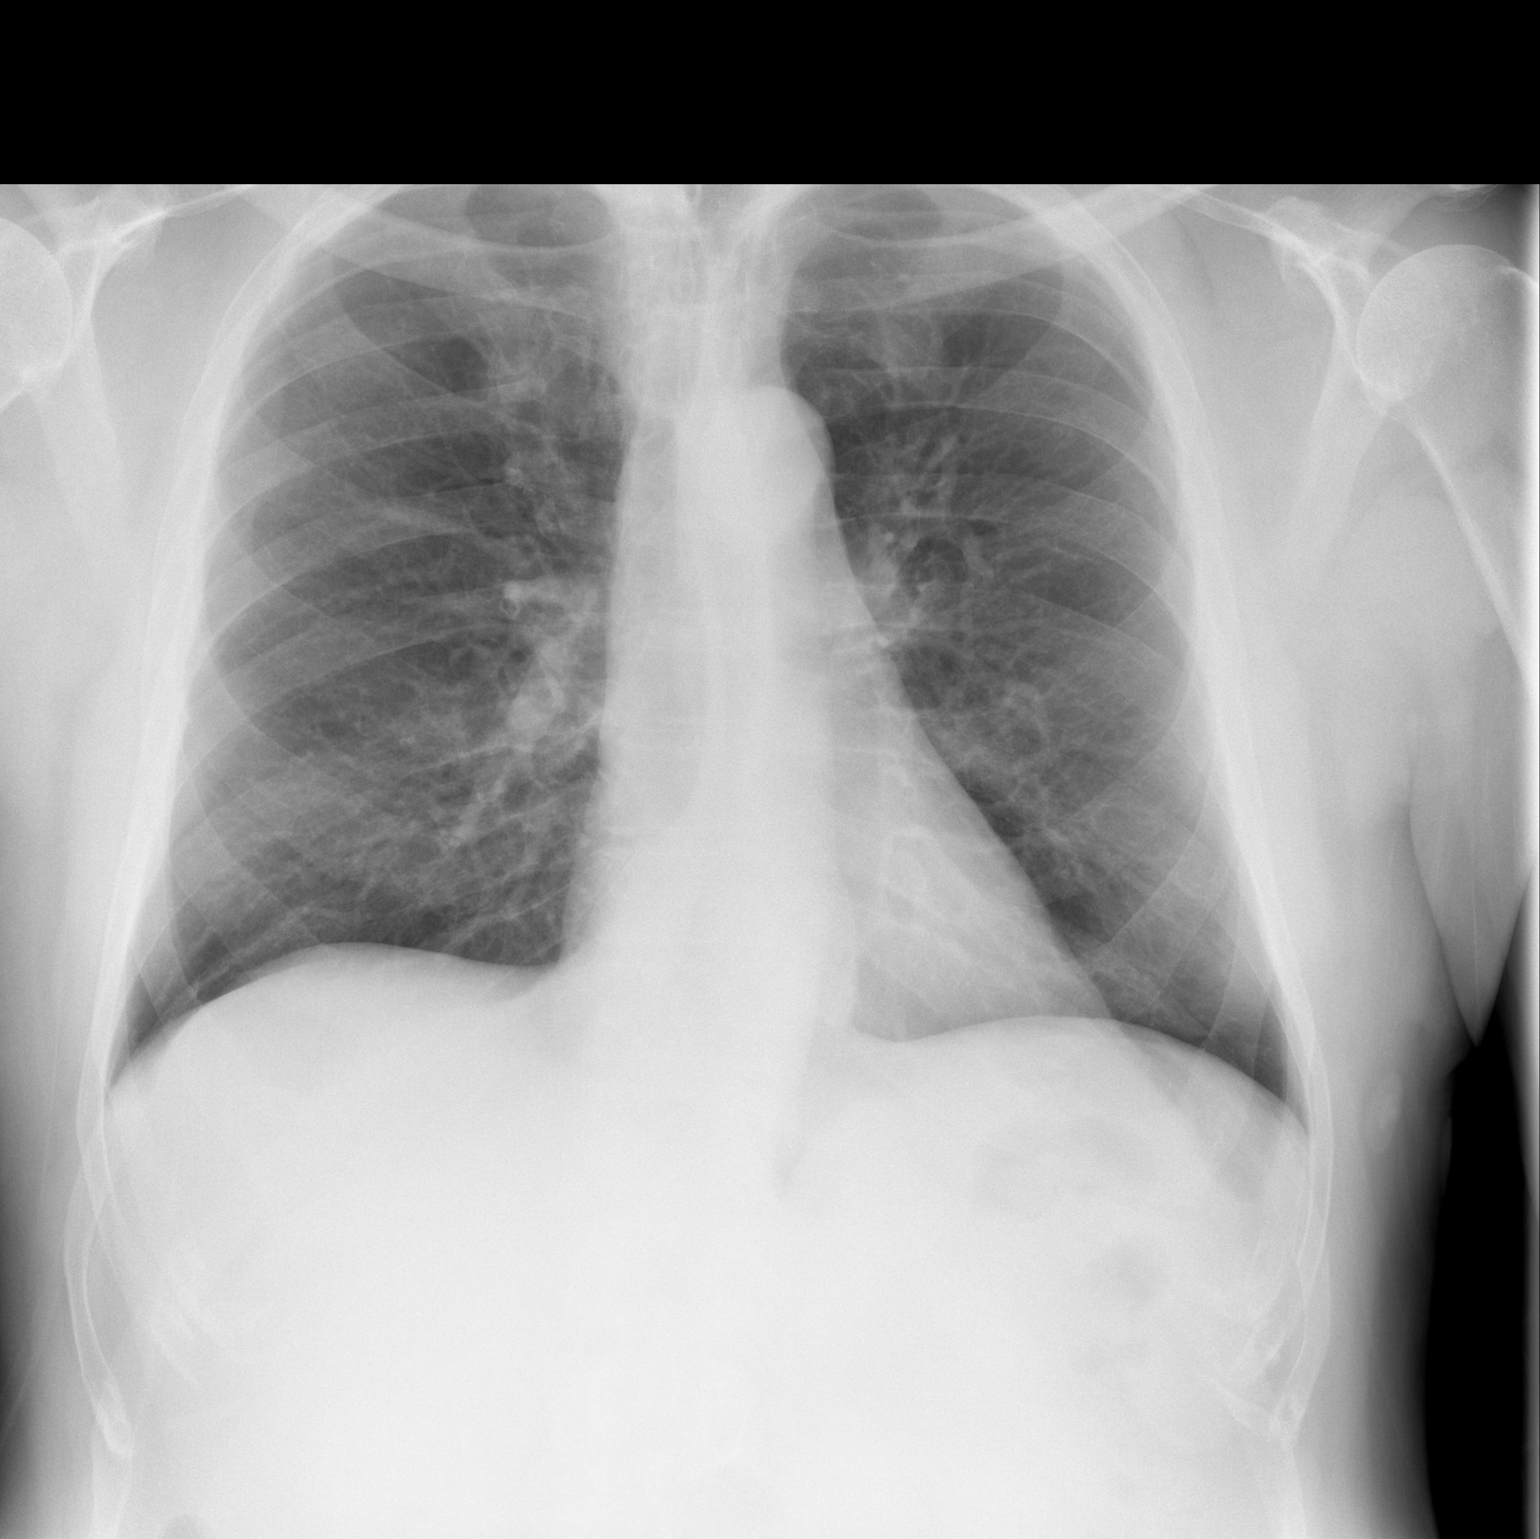

[w chest lat]
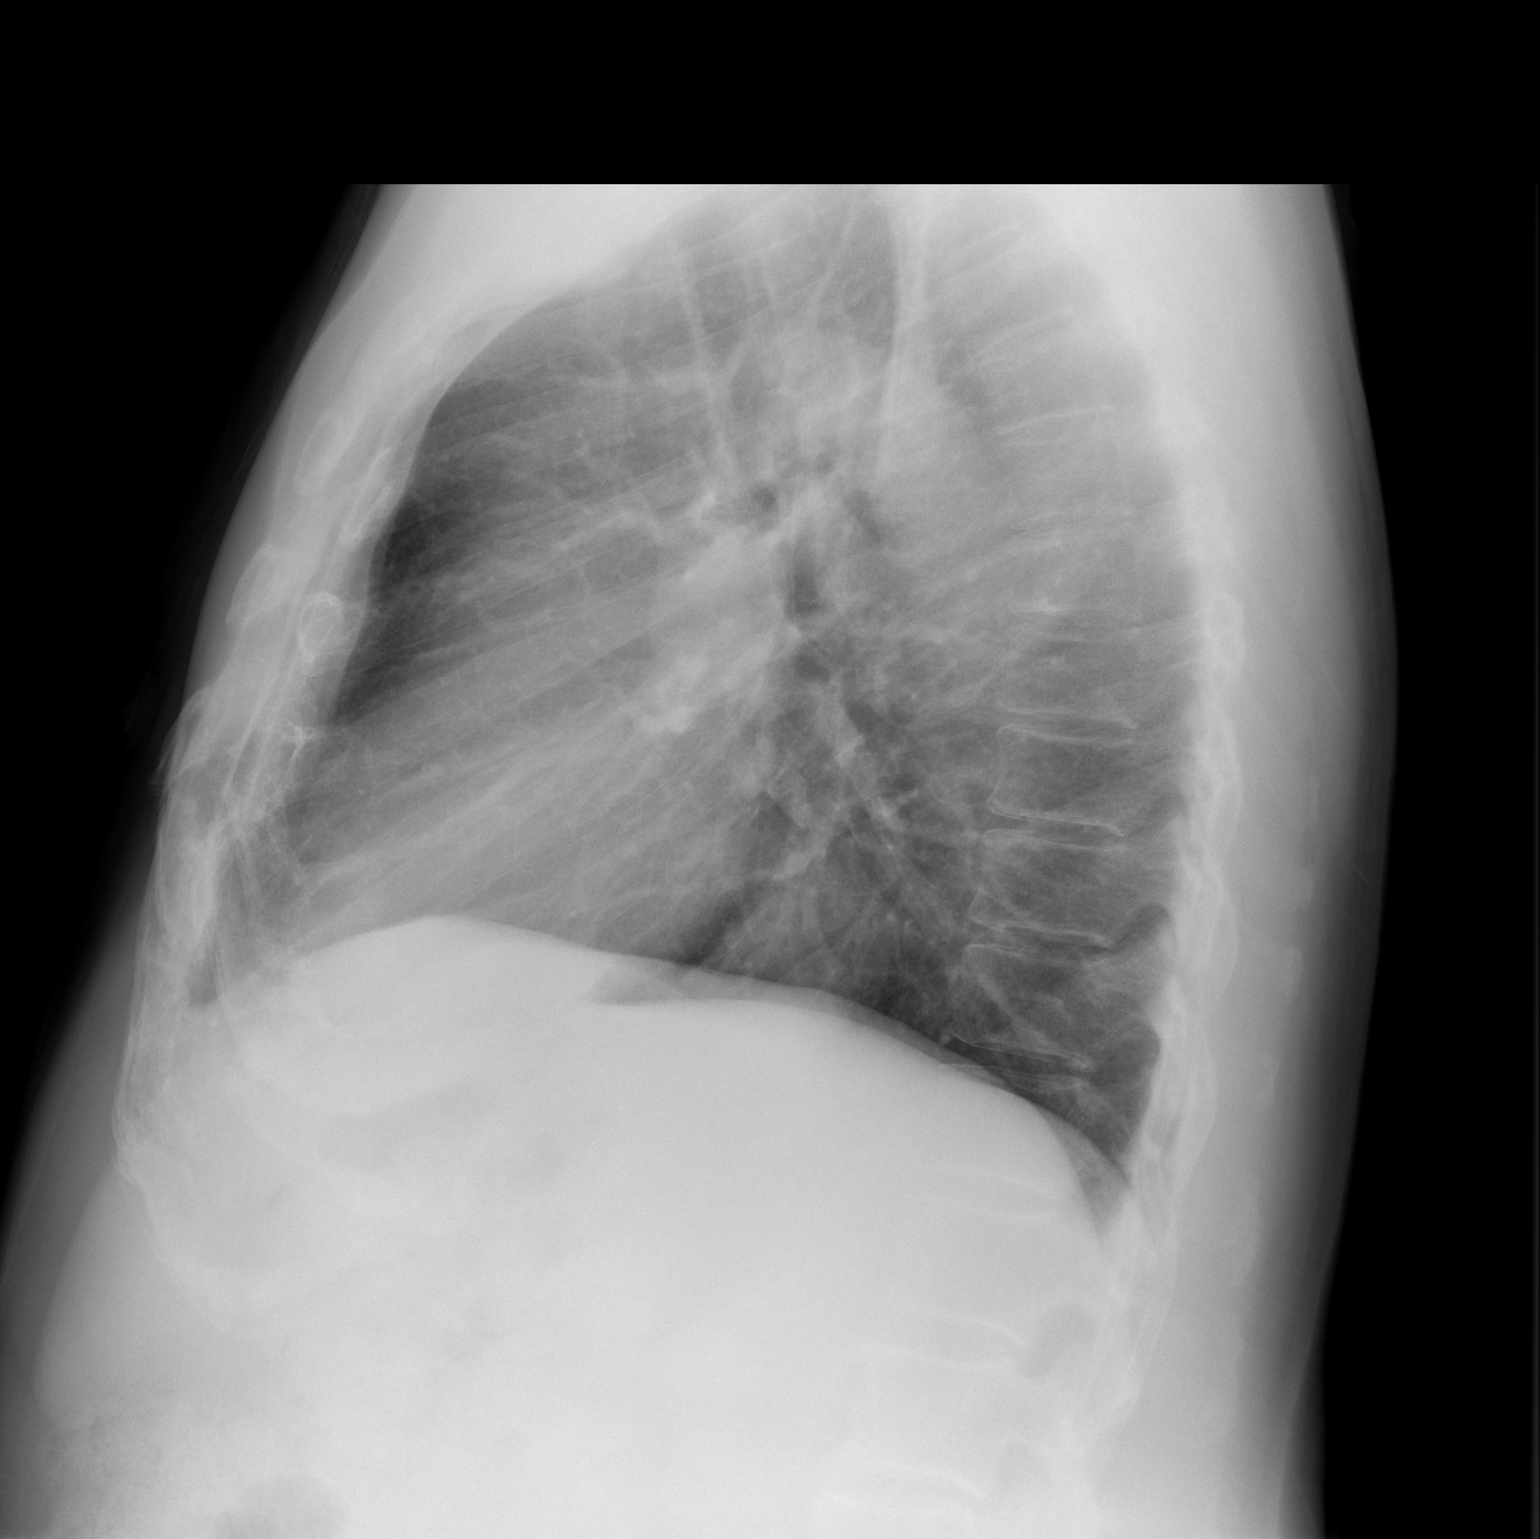

[2 of 2 positions shown; findings below may reference images not displayed]

FINDINGS: No consolidation or edema. No pleural effusion or pneumothorax.
Heart size is normal. Hiatal hernia is again seen. Included osseous
structures appear intact.
IMPRESSION: No acute process in the chest.
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
